# Patient Record
Sex: Male | Born: 1970 | Race: White | Hispanic: No | Marital: Single | Smoking: Never smoker
Health system: Southern US, Community
[De-identification: ages and names within clinical notes are randomized; demographics above are authoritative.]

## PROBLEM LIST (undated history)

## (undated) DIAGNOSIS — R12 Heartburn: Secondary | ICD-10-CM

## (undated) HISTORY — DX: Heartburn: R12

---

## 2000-07-13 ENCOUNTER — Ambulatory Visit (HOSPITAL_COMMUNITY): Admission: RE | Admit: 2000-07-13 | Discharge: 2000-07-13 | Payer: Self-pay

## 2001-02-21 ENCOUNTER — Ambulatory Visit (HOSPITAL_COMMUNITY): Admission: RE | Admit: 2001-02-21 | Discharge: 2001-02-21 | Payer: Self-pay | Admitting: Urology

## 2001-02-21 ENCOUNTER — Encounter: Payer: Self-pay | Admitting: Urology

## 2001-02-23 ENCOUNTER — Emergency Department (HOSPITAL_COMMUNITY): Admission: EM | Admit: 2001-02-23 | Discharge: 2001-02-23 | Payer: Self-pay | Admitting: Emergency Medicine

## 2001-02-23 ENCOUNTER — Encounter: Payer: Self-pay | Admitting: Emergency Medicine

## 2007-01-08 ENCOUNTER — Emergency Department (HOSPITAL_COMMUNITY): Admission: EM | Admit: 2007-01-08 | Discharge: 2007-01-08 | Payer: Self-pay | Admitting: Emergency Medicine

## 2008-01-07 ENCOUNTER — Emergency Department (HOSPITAL_COMMUNITY): Admission: EM | Admit: 2008-01-07 | Discharge: 2008-01-07 | Payer: Self-pay | Admitting: Family Medicine

## 2009-04-07 ENCOUNTER — Ambulatory Visit: Payer: Self-pay | Admitting: Internal Medicine

## 2009-04-07 DIAGNOSIS — Z87442 Personal history of urinary calculi: Secondary | ICD-10-CM

## 2009-04-07 DIAGNOSIS — K219 Gastro-esophageal reflux disease without esophagitis: Secondary | ICD-10-CM

## 2009-04-07 LAB — CONVERTED CEMR LAB
ALT: 12 units/L (ref 0–53)
AST: 17 units/L (ref 0–37)
Albumin: 3.9 g/dL (ref 3.5–5.2)
Alkaline Phosphatase: 69 units/L (ref 39–117)
BUN: 10 mg/dL (ref 6–23)
Basophils Absolute: 0 10*3/uL (ref 0.0–0.1)
Basophils Relative: 0.1 % (ref 0.0–3.0)
Bilirubin Urine: NEGATIVE
Bilirubin, Direct: 0.1 mg/dL (ref 0.0–0.3)
CO2: 33 meq/L — ABNORMAL HIGH (ref 19–32)
Calcium: 9.3 mg/dL (ref 8.4–10.5)
Chloride: 104 meq/L (ref 96–112)
Cholesterol: 191 mg/dL (ref 0–200)
Creatinine, Ser: 1 mg/dL (ref 0.4–1.5)
Eosinophils Absolute: 0.1 10*3/uL (ref 0.0–0.7)
Eosinophils Relative: 1.7 % (ref 0.0–5.0)
GFR calc non Af Amer: 88.83 mL/min (ref >60)
Glucose, Bld: 88 mg/dL (ref 70–99)
HCT: 46.4 % (ref 39.0–52.0)
HDL: 40.3 mg/dL (ref >39.00)
Hemoglobin, Urine: NEGATIVE
Hemoglobin: 16.5 g/dL (ref 13.0–17.0)
Ketones, ur: NEGATIVE mg/dL
LDL Cholesterol: 135 mg/dL — ABNORMAL HIGH (ref 0–99)
Leukocytes, UA: NEGATIVE
Lymphocytes Relative: 30.5 % (ref 12.0–46.0)
Lymphs Abs: 1.9 10*3/uL (ref 0.7–4.0)
MCHC: 35.6 g/dL (ref 30.0–36.0)
MCV: 83.5 fL (ref 78.0–100.0)
Monocytes Absolute: 0.5 10*3/uL (ref 0.1–1.0)
Monocytes Relative: 8.5 % (ref 3.0–12.0)
Neutro Abs: 3.6 10*3/uL (ref 1.4–7.7)
Neutrophils Relative %: 59.2 % (ref 43.0–77.0)
Nitrite: NEGATIVE
Platelets: 200 10*3/uL (ref 150.0–400.0)
Potassium: 4.7 meq/L (ref 3.5–5.1)
RBC: 5.56 M/uL (ref 4.22–5.81)
RDW: 12.8 % (ref 11.5–14.6)
Sodium: 143 meq/L (ref 135–145)
Specific Gravity, Urine: 1.02 (ref 1.000–1.030)
TSH: 1.4 microintl units/mL (ref 0.35–5.50)
Total Bilirubin: 1.2 mg/dL (ref 0.3–1.2)
Total CHOL/HDL Ratio: 5
Total Protein, Urine: NEGATIVE mg/dL
Total Protein: 8 g/dL (ref 6.0–8.3)
Triglycerides: 81 mg/dL (ref 0.0–149.0)
Urine Glucose: NEGATIVE mg/dL
Urobilinogen, UA: 0.2 (ref 0.0–1.0)
VLDL: 16.2 mg/dL (ref 0.0–40.0)
WBC: 6.1 10*3/uL (ref 4.5–10.5)
pH: 6 (ref 5.0–8.0)

## 2009-04-08 LAB — CONVERTED CEMR LAB

## 2010-02-05 ENCOUNTER — Encounter: Admission: RE | Admit: 2010-02-05 | Discharge: 2010-02-05 | Payer: Self-pay | Admitting: Geriatric Medicine

## 2010-03-05 ENCOUNTER — Ambulatory Visit (HOSPITAL_COMMUNITY): Admission: RE | Admit: 2010-03-05 | Discharge: 2010-03-05 | Payer: Self-pay | Admitting: Gastroenterology

## 2010-12-30 ENCOUNTER — Emergency Department: Payer: Self-pay | Admitting: Emergency Medicine

## 2011-05-14 NOTE — Op Note (Signed)
[facility]  Patient:    Michael Meyers, Michael Meyers                   MRN: [id number] Proc. Date: 02/21/01 Adm. Date:  [id number] Attending:  Evlyn Clines CC:         [facility]   Operative Report  PROCEDURE:  Left ureteroscopic stone extraction with insertion of left double-J stent.  PREOPERATIVE DIAGNOSIS:  Left ureterovesical junction stone.  POSTOPERATIVE DIAGNOSIS:  Left ureterovesical junction stone.  SURGEON:  Excell Seltzer. Annabell Howells, M.D.  ANESTHESIA:  General.  SPECIMEN:  Stone.  DRAINS:  6 [place] with 26 cm double-J stent.  COMPLICATIONS:  None.  INDICATIONS:  [name] is a 40 year old white male with a 3-5 mm left UVJ stone, who has failed to pass it despite trial of conservative therapy.  He is to undergo ureteroscopy.  FINDINGS AT PROCEDURE:  The patient was given p.o. Tequin.  He was taken to the operating room where a general anesthetic was induced.  He was placed in lithotomy position.  His perineum and genitalia were prepped with Betadine solution.  He was draped in the usual sterile fashion.  A 6 French short ureteroscope was passed per urethra.  The right ureteral orifice was identified, and a guidewire was passed through the ureteroscope into the distal ureter.  I was unable to pass the ureteroscope because of some stenosis in the distal ureter.  The ureteroscope was then removed.  The wire was advanced to the kidney.  A 22 French cystoscope with a 12 degree lens was inserted over the wire, and a balloon dilation catheter with a 4 cm x 15 French balloon was passed over the wire across the ureteral meatus.  The balloon was dilated to 12 atmospheres until the lace disappeared.  The balloon was deflated and removed.  The cystoscope was removed, leaving the wire up to the kidney and the sheath in place.  The 6 French ureteroscope was then passed alongside the wire up the ureter.  The stone was visualized.  It was grasped with a 3  [place] flatwire basket and removed without difficulty.  The cystoscope was then replaced over the wire, and a 6 French 26 cm double-J stent with string was placed without difficulty to the kidney.  The wire was removed leaving good coil in the kidney and good coil in the bladder. Inspection of the bladder was performed prior to removal of the scope.  No abnormalities were noted.  The bladder was then drained.  The cystoscope was removed leaving the stent string exiting the urethra.  The string was secured to the patients penis.  He was taken down from the lithotomy position.  His anesthetic was reversed.  He was moved to the recovery room in stable condition, and there were no complications. DD:  02/21/01 TD:  02/22/01 Job: [id number]

## 2011-05-14 NOTE — Op Note (Signed)
[facility]  Patient:    Michael Meyers, Michael Meyers                          MRN: [id number] Proc. Date: 07/13/00 Attending:  Zigmund Daniel, M.D.                           Operative Report  PREOPERATIVE DIAGNOSIS:  Symptomatic varicose veins of the right leg.  POSTOPERATIVE DIAGNOSIS:  Symptomatic varicose veins of the right leg.  OPERATION PERFORMED:  Ligation of the long and short saphenous veins in the leg with ligation and excision of perforators and varicosities.  SURGEON:  Zigmund Daniel, M.D.  ANESTHESIA:  General.  DESCRIPTION OF PROCEDURE:  Prior to going to the operating room, I had the patient to stand and I marked all the visible varicosities in the right leg. There were none in the thigh. The long saphenous vein itself did not appear to be varicose although the short saphenous did. After routine preparation and draping of the leg following induction of general anesthesia, I made multiple incisions where the varicosities had been marked. In the upper medial leg, I cut down on the long saphenous vein at the point where it fed off into a branch varicosity which was quite large. I ligated the long saphenous proximally and distally there and then avulsed the varicosity along with many other varicosities. Wherever I discovered perforating veins or small contributing veins, I tied them off with 3-0 Vicryl. I also dissected out the short saphenous vein as it went deep in the proximal posterior leg and ligated it near its entry into the muscle fascia going toward the popliteal vein. Distally I avulsed the short saphenous along with multiple segments of large varicosities particularly in the posterior leg. After removing all the marked varicosities and getting hemostasis with pressure, I closed each skin incision with a staple or two and applied a bulky compressive bandage.  Blood loss was not excessive. The patient tolerated the procedure well. DD:   07/13/00 TD:  07/15/00 Job: [id number]

## 2011-09-16 LAB — POCT RAPID STREP A: Streptococcus, Group A Screen (Direct): POSITIVE — AB

## 2013-11-01 ENCOUNTER — Other Ambulatory Visit: Payer: Self-pay

## 2014-09-17 ENCOUNTER — Ambulatory Visit (INDEPENDENT_AMBULATORY_CARE_PROVIDER_SITE_OTHER): Payer: BC Managed Care – PPO

## 2014-09-17 VITALS — BP 148/96 | HR 64 | Resp 12

## 2014-09-17 DIAGNOSIS — R52 Pain, unspecified: Secondary | ICD-10-CM

## 2014-09-17 DIAGNOSIS — B351 Tinea unguium: Secondary | ICD-10-CM

## 2014-09-17 DIAGNOSIS — M722 Plantar fascial fibromatosis: Secondary | ICD-10-CM

## 2014-09-17 DIAGNOSIS — M7730 Calcaneal spur, unspecified foot: Secondary | ICD-10-CM

## 2014-09-17 MED ORDER — EFINACONAZOLE 10 % EX SOLN: CUTANEOUS | Status: AC

## 2014-09-17 MED ORDER — MELOXICAM 15 MG PO TABS: 15.0000 mg | ORAL_TABLET | Freq: Every day | ORAL | Status: DC

## 2014-09-17 NOTE — Patient Instructions (Signed)
ICE INSTRUCTIONS  Apply ice or cold pack to the affected area at least 3 times a day for 10-15 minutes each time.  You should also use ice after prolonged activity or vigorous exercise.  Do not apply ice longer than 20 minutes at one time.  Always keep a cloth between your skin and the ice pack to prevent burns.  Being consistent and following these instructions will help control your symptoms.  We suggest you purchase a gel ice pack because they are reusable and do bit leak.  Some of them are designed to wrap around the area.  Use the method that works best for you.  Here are some other suggestions for icing.   Use a frozen bag of peas or corn-inexpensive and molds well to your body, usually stays frozen for 10 to 20 minutes.  Wet a towel with cold water and squeeze out the excess until it's damp.  Place in a bag in the freezer for 20 minutes. Then remove and use.   For bathing while tape was in place. Wrap the tape that foot in saran wrap, apply a ankle sock and follow with freezer bag secured with rubber band and duct tape

## 2014-09-17 NOTE — Progress Notes (Signed)
   Subjective:    Patient ID: Michael Meyers, male    DOB: [birth year], 43 y.o.   MRN: [id number]  HPI PT STATED B/L BOTTOM OF THE HEELS BEEN HURTING FOR 9 MONTHS. THE HEELS ARE GETTING WORSE BUT SOMETIMES IS BETTER. THE HEEL GET AGGRAVATED SITTING/WALKING. TRIED ICE AND STRETCHING BUT NO HELP.   ALSO, CHECK LT FOOT GREAT TOENAIL HAVE DISCOLORATION.   Review of Systems  All other systems reviewed and are negative.      Objective:   Physical Exam 43 year old white male well-developed well-nourished oriented x3 presents this time with pain in both heels right more severe than left right is been going on for several months now left is more recent pain on first up in the morning or getting up for a period of rest. Also left hallux nails show some yellowing and thickening discoloration and brittleness consistent with onychomycosis.  Lower extremity objective findings revealed masker status intact pedal pulses palpable DP and PT +2/4 bilateral capillary refill time 3 seconds. Epicritic and proprioceptive sensations intact and symmetric bilateral there is normal plantar response DTRs not elicited dermatologically skin color pigment normal hair growth present nails unremarkable. Patient does occasionally take aspirin, however accidentally marked it as an allergy, this was corrected. Neurologically epicritic and proprioceptive sensations intact normal plantar response and DTRs neurologically skin color pigment normal hair growth absent nails unremarkable except for the left great toenail showing yellowing discoloration and brittleness and friability consistent with onychomycosis both heels painful on palpation medial calcaneal tubercle medial arch bilateral well-developed inferior calcaneus spurs noted on x-rays there is some fascial thickening noted no open wounds no ulcers no secondary infection is noted       Assessment & Plan:  Assessment #1 onychomycosis left hallux nail we'll initiate treatment  with Jubilee a topical antifungal which is called in from underlying pharmacy to patient will apply once daily as instructed for 12 months duration.  Assessment #2 plantar fasciitis bilateral. Fascial strapping applied to both feet recommended ice prescription for [facility] is given also recommend crocs for around the house no barefoot or flimsy shoes recheck in 2 weeks for followup fascial strapping is to be maintained for 5 days as instructed may be candidate for orthoses based on progress  Alvan Dame DPM

## 2014-10-01 ENCOUNTER — Ambulatory Visit: Payer: BC Managed Care – PPO

## 2014-10-01 VITALS — BP 134/83 | HR 63 | Resp 12

## 2014-10-01 DIAGNOSIS — M722 Plantar fascial fibromatosis: Secondary | ICD-10-CM

## 2014-10-01 DIAGNOSIS — M7730 Calcaneal spur, unspecified foot: Secondary | ICD-10-CM

## 2014-10-01 DIAGNOSIS — R52 Pain, unspecified: Secondary | ICD-10-CM

## 2014-10-01 NOTE — Patient Instructions (Signed)

## 2014-10-01 NOTE — Progress Notes (Signed)
   Subjective:    Patient ID: Michael Meyers, male    DOB: 10/04/1971, 43 y.o.   MRN: [id number]  HPI Comments: Pt states he has continued to take the Mobic and the strappings helped some, but the feet are still tender.     Review of Systems no new findings or systemic changes noted    Objective:   Physical Exam Neurovascular status intact pedal pulses are palpable patient had improvement with a fascial strapping based on the fact would benefit from orthoses has a very high deductible otherwise does have coverage may be K. for prescription arthrosis in the future however this time we'll try OTC type power step orthotics. Patient given written instructions for use orthotics in break and we'll reassess in 1-2 months for adjustments if needed       Assessment & Plan:  Assessment plantar fasciitis/heel spur syndrome has been using the topical antifungal as instructed and prescribed to get the medication at this time is using the NSAID orally and the taping health so therefore functional orthoses with beneficial power step orthotics dispensed at this time recheck in one to 2 months for adjustments if needed  Alvan Dameichard Chayce Robbins DPM

## 2014-10-11 ENCOUNTER — Other Ambulatory Visit: Payer: Self-pay

## 2014-11-19 ENCOUNTER — Other Ambulatory Visit: Payer: Self-pay

## 2015-02-21 ENCOUNTER — Other Ambulatory Visit: Payer: Self-pay

## 2015-07-05 ENCOUNTER — Other Ambulatory Visit: Payer: Self-pay

## 2015-07-07 NOTE — Telephone Encounter (Signed)
Pt needs an appt prior to future refills. 

## 2016-02-18 ENCOUNTER — Other Ambulatory Visit (HOSPITAL_COMMUNITY)
Admission: RE | Admit: 2016-02-18 | Discharge: 2016-02-18 | Disposition: A | Discharge status: Home / Self Care | Payer: BLUE CROSS/BLUE SHIELD | Source: Ambulatory Visit | Attending: Family Medicine | Admitting: Family Medicine

## 2016-02-18 ENCOUNTER — Emergency Department (INDEPENDENT_AMBULATORY_CARE_PROVIDER_SITE_OTHER)
Admission: EM | Admit: 2016-02-18 | Discharge: 2016-02-18 | Disposition: A | Discharge status: Home / Self Care | Payer: BLUE CROSS/BLUE SHIELD | Source: Home / Self Care | Attending: Family Medicine | Admitting: Family Medicine

## 2016-02-18 ENCOUNTER — Encounter (HOSPITAL_COMMUNITY): Payer: Self-pay | Admitting: *Deleted

## 2016-02-18 DIAGNOSIS — J111 Influenza due to unidentified influenza virus with other respiratory manifestations: Secondary | ICD-10-CM | POA: Insufficient documentation

## 2016-02-18 DIAGNOSIS — R69 Illness, unspecified: Principal | ICD-10-CM

## 2016-02-18 LAB — POCT RAPID STREP A: Streptococcus, Group A Screen (Direct): NEGATIVE

## 2016-02-18 MED ORDER — IBUPROFEN 800 MG PO TABS: 800.0000 mg | ORAL_TABLET | Freq: Three times a day (TID) | ORAL | Status: AC

## 2016-02-18 MED ORDER — IPRATROPIUM BROMIDE 0.06 % NA SOLN: 2.0000 | Freq: Four times a day (QID) | NASAL | Status: AC

## 2016-02-18 MED ORDER — ONDANSETRON HCL 4 MG PO TABS
4.0000 mg | ORAL_TABLET | Freq: Three times a day (TID) | ORAL | Status: AC | PRN
Start: 2016-02-18 — End: ?

## 2016-02-18 MED ORDER — ONDANSETRON HCL 4 MG/2ML IJ SOLN
4.0000 mg | Freq: Once | INTRAMUSCULAR | Status: AC
  Administered 2016-02-18: 4 mg via INTRAMUSCULAR

## 2016-02-18 MED ORDER — ONDANSETRON HCL 4 MG/2ML IJ SOLN
INTRAMUSCULAR | Status: AC
  Filled 2016-02-18: qty 2
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
## 2016-02-18 NOTE — ED Notes (Signed)
Pt  Reports  Symptoms  Of       Headache      Body   Aches           sorethroat       As  Well          With  Symptoms  X  4  Days              Pt  Reports  Frequent  Urination  As   Well
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
## 2016-02-21 LAB — CULTURE, GROUP A STREP (THRC)

## 2016-09-10 ENCOUNTER — Other Ambulatory Visit: Payer: Self-pay | Admitting: Podiatry

## 2016-09-24 ENCOUNTER — Telehealth: Payer: Self-pay | Admitting: *Deleted

## 2016-09-24 NOTE — Telephone Encounter (Signed)
Received fax request for refill Meloxicam. Denied pt has not been seen in office since 2015. Return faxed.

## 2016-10-11 DIAGNOSIS — S90421A Blister (nonthermal), right great toe, initial encounter: Secondary | ICD-10-CM | POA: Diagnosis not present

## 2016-10-11 DIAGNOSIS — S90821A Blister (nonthermal), right foot, initial encounter: Secondary | ICD-10-CM | POA: Diagnosis not present

## 2016-10-11 DIAGNOSIS — L0889 Other specified local infections of the skin and subcutaneous tissue: Secondary | ICD-10-CM | POA: Diagnosis not present

## 2016-10-11 DIAGNOSIS — L089 Local infection of the skin and subcutaneous tissue, unspecified: Secondary | ICD-10-CM | POA: Diagnosis not present

## 2018-09-12 ENCOUNTER — Ambulatory Visit
Admission: RE | Admit: 2018-09-12 | Discharge: 2018-09-12 | Disposition: A | Discharge status: Home / Self Care | Payer: BLUE CROSS/BLUE SHIELD | Source: Ambulatory Visit | Attending: Geriatric Medicine | Admitting: Geriatric Medicine

## 2018-09-12 ENCOUNTER — Other Ambulatory Visit: Payer: Self-pay | Admitting: Geriatric Medicine

## 2018-09-12 DIAGNOSIS — M542 Cervicalgia: Secondary | ICD-10-CM | POA: Diagnosis not present

## 2018-09-12 DIAGNOSIS — E7800 Pure hypercholesterolemia, unspecified: Secondary | ICD-10-CM | POA: Diagnosis not present

## 2018-09-12 DIAGNOSIS — D649 Anemia, unspecified: Secondary | ICD-10-CM | POA: Diagnosis not present

## 2018-09-12 DIAGNOSIS — Z23 Encounter for immunization: Secondary | ICD-10-CM | POA: Diagnosis not present

## 2018-09-12 DIAGNOSIS — Z79899 Other long term (current) drug therapy: Secondary | ICD-10-CM | POA: Diagnosis not present

## 2018-09-12 DIAGNOSIS — Z0000 Encounter for general adult medical examination without abnormal findings: Secondary | ICD-10-CM | POA: Diagnosis not present

## 2018-09-13 ENCOUNTER — Other Ambulatory Visit: Payer: Self-pay | Admitting: Geriatric Medicine

## 2018-09-13 DIAGNOSIS — R7401 Elevation of levels of liver transaminase levels: Secondary | ICD-10-CM

## 2018-09-13 DIAGNOSIS — R740 Nonspecific elevation of levels of transaminase and lactic acid dehydrogenase [LDH]: Principal | ICD-10-CM

## 2018-09-20 ENCOUNTER — Ambulatory Visit
Admission: RE | Admit: 2018-09-20 | Discharge: 2018-09-20 | Disposition: A | Discharge status: Home / Self Care | Payer: BLUE CROSS/BLUE SHIELD | Source: Ambulatory Visit | Attending: Geriatric Medicine | Admitting: Geriatric Medicine

## 2018-09-20 DIAGNOSIS — R740 Nonspecific elevation of levels of transaminase and lactic acid dehydrogenase [LDH]: Principal | ICD-10-CM

## 2018-09-20 DIAGNOSIS — B179 Acute viral hepatitis, unspecified: Secondary | ICD-10-CM | POA: Diagnosis not present

## 2018-09-20 DIAGNOSIS — R7401 Elevation of levels of liver transaminase levels: Secondary | ICD-10-CM

## 2018-09-21 DIAGNOSIS — R748 Abnormal levels of other serum enzymes: Secondary | ICD-10-CM | POA: Diagnosis not present

## 2018-09-21 DIAGNOSIS — K219 Gastro-esophageal reflux disease without esophagitis: Secondary | ICD-10-CM | POA: Diagnosis not present

## 2018-10-13 DIAGNOSIS — B179 Acute viral hepatitis, unspecified: Secondary | ICD-10-CM | POA: Diagnosis not present

## 2018-11-02 DIAGNOSIS — R740 Nonspecific elevation of levels of transaminase and lactic acid dehydrogenase [LDH]: Secondary | ICD-10-CM | POA: Diagnosis not present

## 2018-11-27 DIAGNOSIS — B169 Acute hepatitis B without delta-agent and without hepatic coma: Secondary | ICD-10-CM | POA: Diagnosis not present

## 2019-02-05 IMAGING — US US ABDOMEN LIMITED
1 series · 14 of 25 positions shown · non-contrast
Comparison: Patient's prior CT of the abdomen is not available on
PACs for comparison.

CLINICAL DATA: Elevated transaminase level.

EXAM:
ULTRASOUND ABDOMEN LIMITED RIGHT UPPER QUADRANT

[Series 1: us abdomen limited · 0.26mm/px · 14 of 41 slices shown]
[im 1/41]
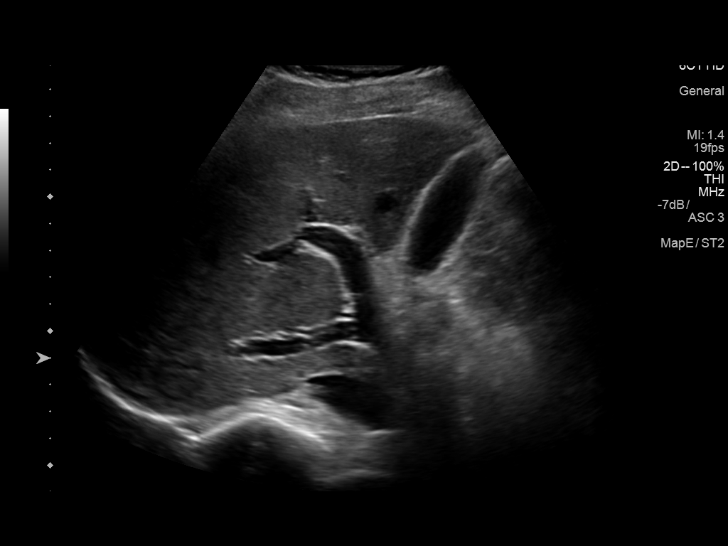
[im 4/41]
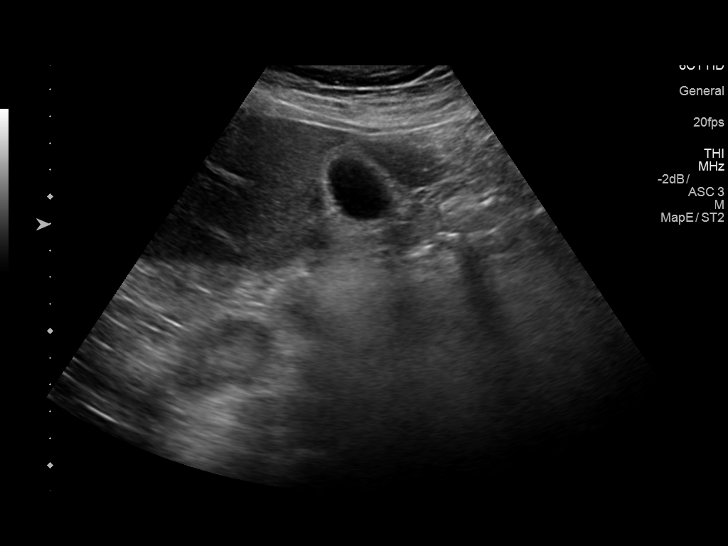
[im 7/41]
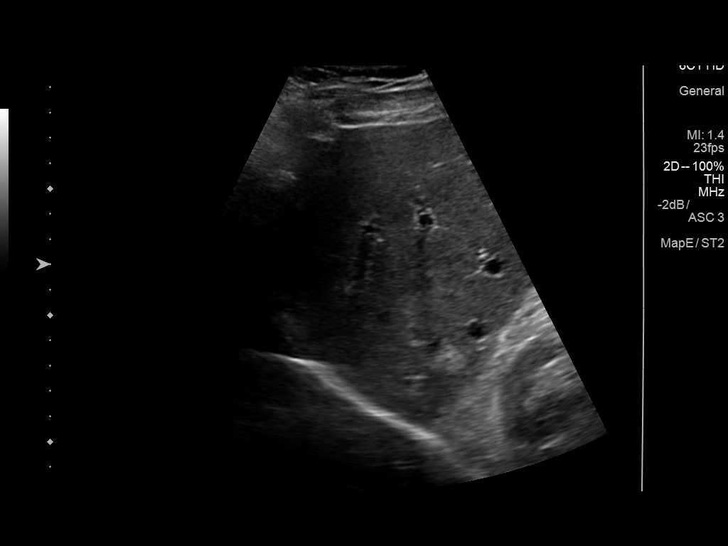
[im 11/41]
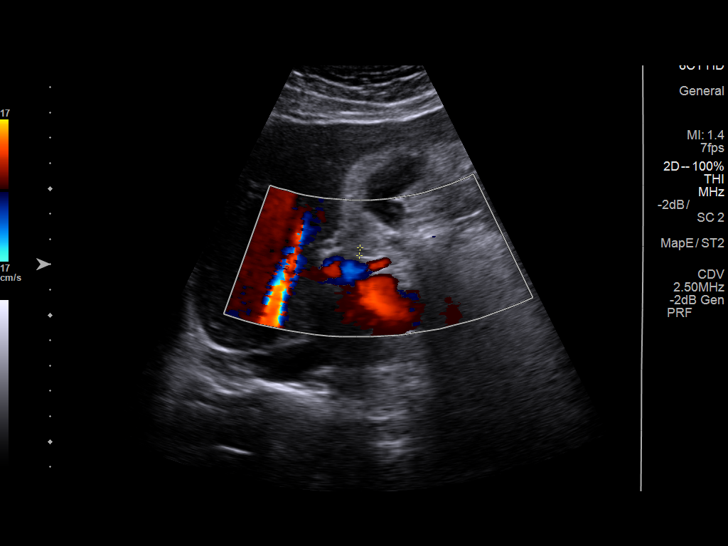
[im 14/41]
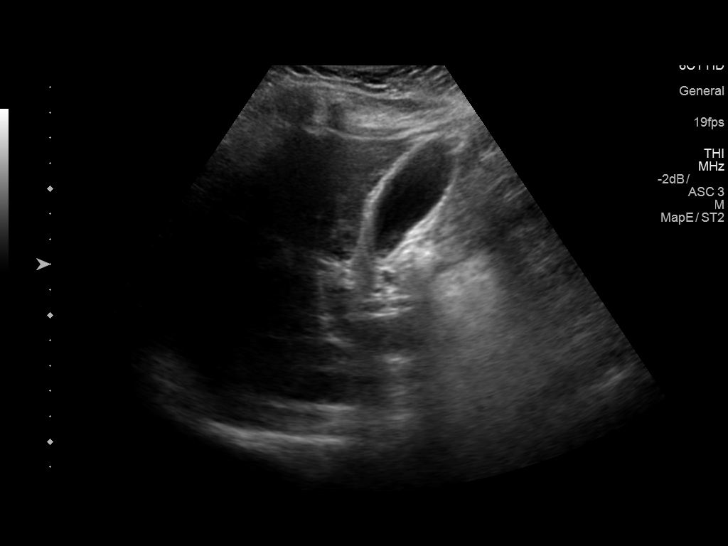
[im 16/41]
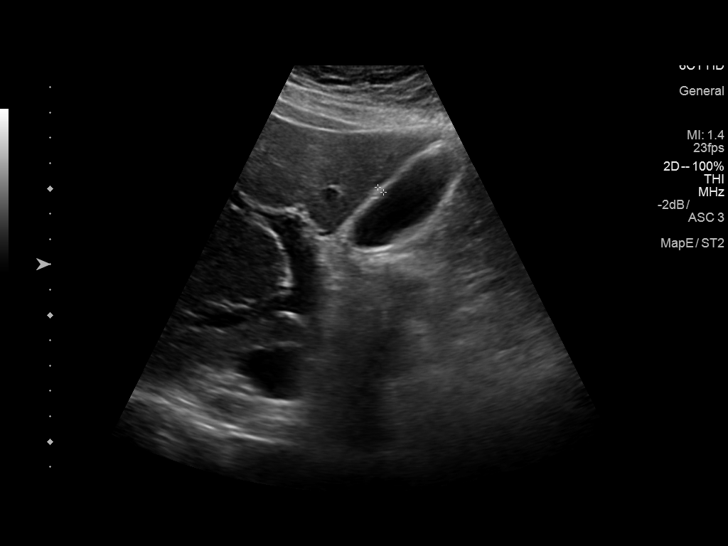
[im 19/41]
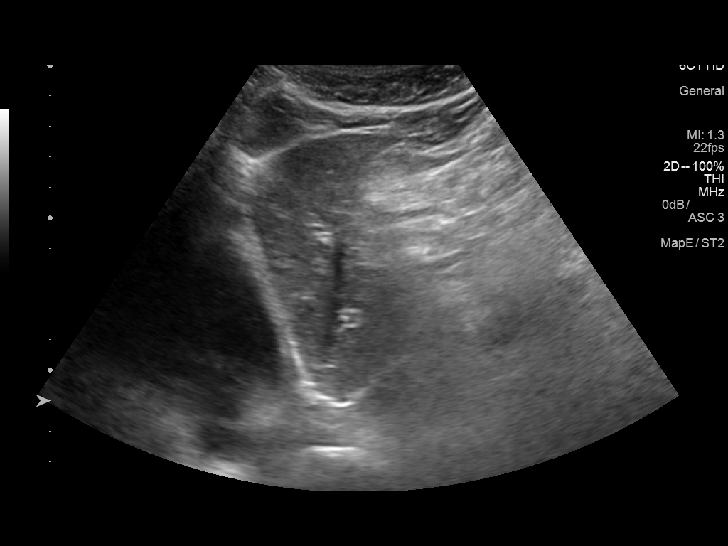
[im 22/41]
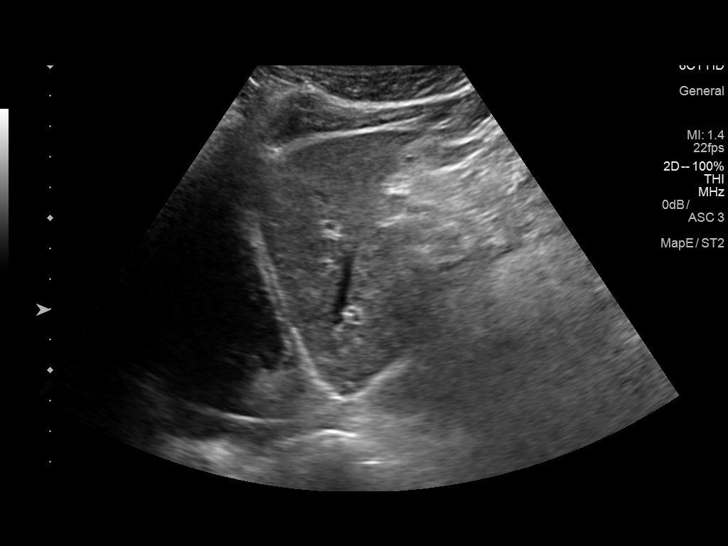
[im 26/41]
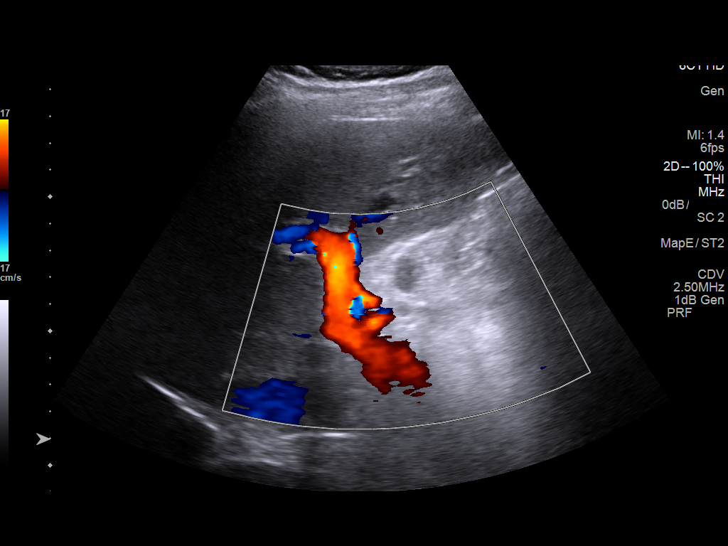
[im 27/41]
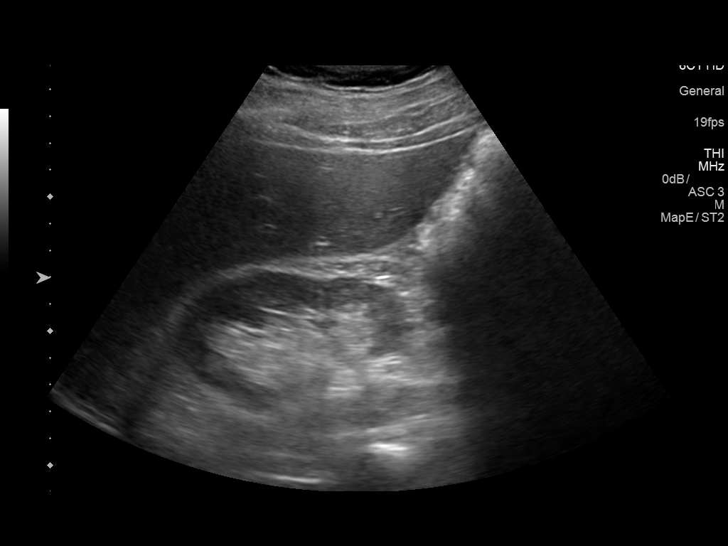
[im 31/41]
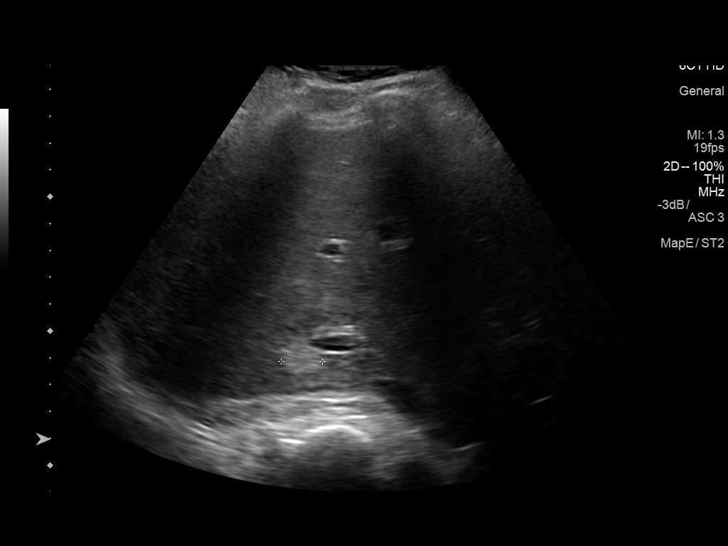
[im 34/41]
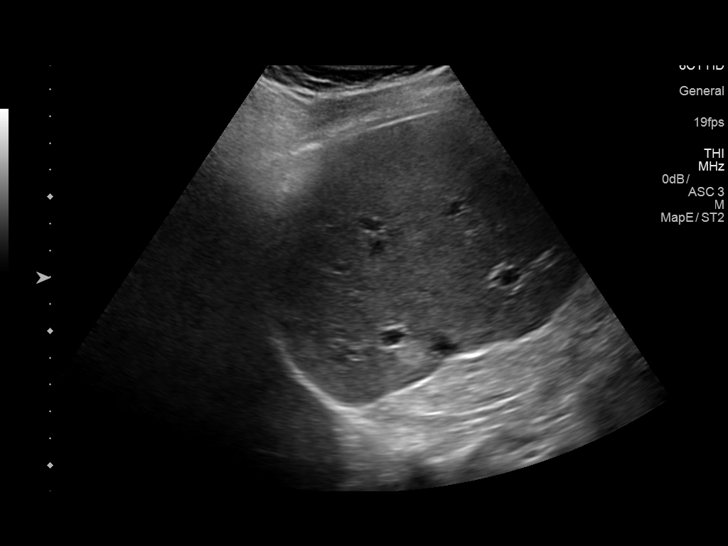
[im 37/41]
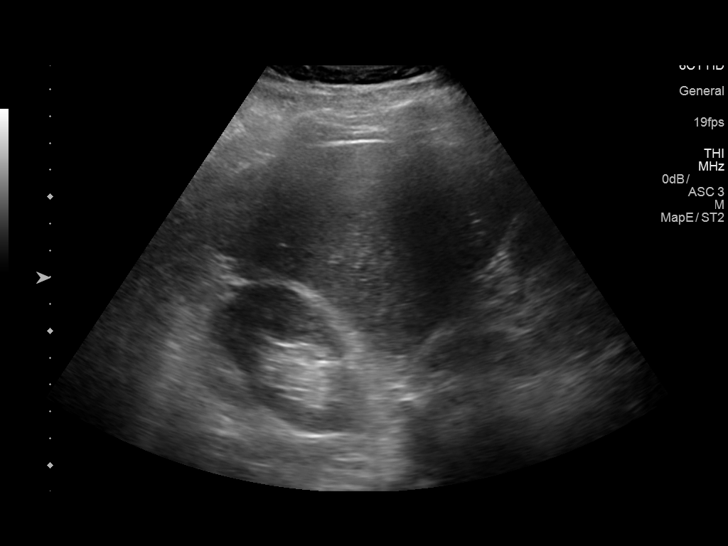
[im 41/41]
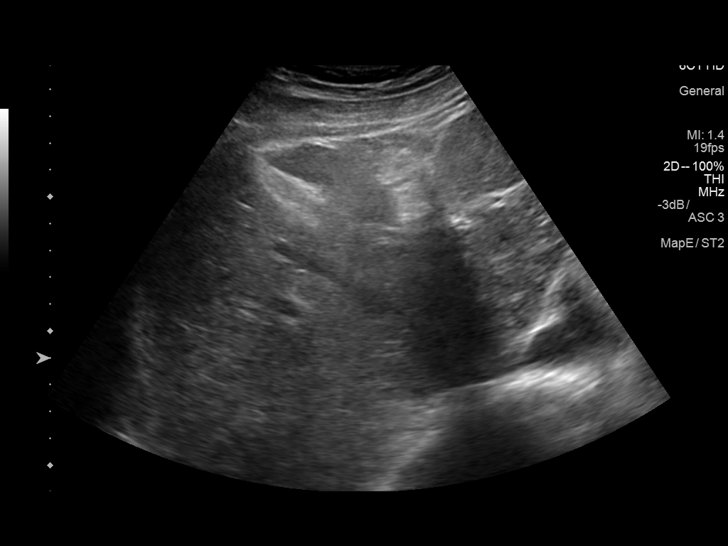

[14 of 25 positions shown; findings below may reference images not displayed]

FINDINGS: Gallbladder:

No gallstones visualized. The gallbladder wall measures 3 mm. No
sonographic Murphy sign noted by sonographer.

Common bile duct:

Diameter: 3.1 mm

Liver:

Focal hyperechogenicity is identified in the right lobe liver
measuring 1.5 x 1.3 x 1.5 cm. Within normal limits in parenchymal
echogenicity. Portal vein is patent on color Doppler imaging with
normal direction of blood flow towards the liver.
IMPRESSION: Focal hyperechogenicity is identified in the right lobe liver
measuring 1.5 x 1.3 x 1.5 cm. Solid lesion is not excluded.

## 2019-02-26 DIAGNOSIS — B169 Acute hepatitis B without delta-agent and without hepatic coma: Secondary | ICD-10-CM | POA: Diagnosis not present

## 2019-09-25 DIAGNOSIS — Z23 Encounter for immunization: Secondary | ICD-10-CM | POA: Diagnosis not present

## 2019-09-25 DIAGNOSIS — Z79899 Other long term (current) drug therapy: Secondary | ICD-10-CM | POA: Diagnosis not present

## 2019-09-25 DIAGNOSIS — Z0000 Encounter for general adult medical examination without abnormal findings: Secondary | ICD-10-CM | POA: Diagnosis not present

## 2019-09-25 DIAGNOSIS — E7800 Pure hypercholesterolemia, unspecified: Secondary | ICD-10-CM | POA: Diagnosis not present

## 2020-04-25 ENCOUNTER — Ambulatory Visit: Payer: BC Managed Care – PPO | Attending: Internal Medicine

## 2020-04-25 DIAGNOSIS — Z23 Encounter for immunization: Secondary | ICD-10-CM

## 2020-04-25 NOTE — Progress Notes (Signed)
   Covid-19 Vaccination Clinic  Name:  Michael Meyers    MRN: [id number] DOB: 11-30-1971  04/25/2020  Mr. Szabo was observed post Covid-19 immunization for 15 minutes without incident. He was provided with Vaccine Information Sheet and instruction to access the V-Safe system.   Mr. Rocks was instructed to call 911 with any severe reactions post vaccine: Marland Kitchen Difficulty breathing  . Swelling of face and throat  . A fast heartbeat  . A bad rash all over body  . Dizziness and weakness   Immunizations Administered    Name Date Dose VIS Date Route   Pfizer COVID-19 Vaccine 04/25/2020  4:51 PM 0.3 mL 02/20/2019 Intramuscular   Manufacturer: [employer], [employer]   Lot: [id number]   NDC: [address]-6

## 2020-05-19 ENCOUNTER — Ambulatory Visit: Payer: BC Managed Care – PPO | Attending: Internal Medicine

## 2020-05-19 DIAGNOSIS — Z23 Encounter for immunization: Secondary | ICD-10-CM

## 2020-05-19 NOTE — Progress Notes (Signed)
   Covid-19 Vaccination Clinic  Name:  Michael Meyers    MRN: [id number] DOB: [birth year]  05/19/2020  Michael Meyers was observed post Covid-19 immunization for 15 minutes without incident. He was provided with Vaccine Information Sheet and instruction to access the V-Safe system.   Michael Meyers was instructed to call 911 with any severe reactions post vaccine: Marland Kitchen Difficulty breathing  . Swelling of face and throat  . A fast heartbeat  . A bad rash all over body  . Dizziness and weakness   Immunizations Administered    Name Date Dose VIS Date Route   Pfizer COVID-19 Vaccine 05/19/2020  3:53 PM 0.3 mL 02/20/2019 Intramuscular   Manufacturer: [employer], [employer]   Lot: [id number]   NDC: [address]-0

## 2020-10-17 DIAGNOSIS — U071 COVID-19: Secondary | ICD-10-CM | POA: Diagnosis not present

## 2021-07-21 DIAGNOSIS — Z125 Encounter for screening for malignant neoplasm of prostate: Secondary | ICD-10-CM | POA: Diagnosis not present

## 2021-07-21 DIAGNOSIS — K9089 Other intestinal malabsorption: Secondary | ICD-10-CM | POA: Diagnosis not present

## 2021-07-21 DIAGNOSIS — E7800 Pure hypercholesterolemia, unspecified: Secondary | ICD-10-CM | POA: Diagnosis not present

## 2021-07-21 DIAGNOSIS — Z0000 Encounter for general adult medical examination without abnormal findings: Secondary | ICD-10-CM | POA: Diagnosis not present

## 2021-07-21 DIAGNOSIS — Z23 Encounter for immunization: Secondary | ICD-10-CM | POA: Diagnosis not present

## 2021-07-21 DIAGNOSIS — Z79899 Other long term (current) drug therapy: Secondary | ICD-10-CM | POA: Diagnosis not present

## 2021-09-29 DIAGNOSIS — H0288A Meibomian gland dysfunction right eye, upper and lower eyelids: Secondary | ICD-10-CM | POA: Diagnosis not present

## 2021-09-29 DIAGNOSIS — H524 Presbyopia: Secondary | ICD-10-CM | POA: Diagnosis not present

## 2021-09-29 DIAGNOSIS — H0102B Squamous blepharitis left eye, upper and lower eyelids: Secondary | ICD-10-CM | POA: Diagnosis not present

## 2021-09-29 DIAGNOSIS — H52221 Regular astigmatism, right eye: Secondary | ICD-10-CM | POA: Diagnosis not present

## 2021-09-29 DIAGNOSIS — H0102A Squamous blepharitis right eye, upper and lower eyelids: Secondary | ICD-10-CM | POA: Diagnosis not present

## 2021-09-29 DIAGNOSIS — H5213 Myopia, bilateral: Secondary | ICD-10-CM | POA: Diagnosis not present

## 2021-09-29 DIAGNOSIS — H0288B Meibomian gland dysfunction left eye, upper and lower eyelids: Secondary | ICD-10-CM | POA: Diagnosis not present

## 2022-04-01 DIAGNOSIS — Z20822 Contact with and (suspected) exposure to covid-19: Secondary | ICD-10-CM | POA: Diagnosis not present

## 2022-08-18 DIAGNOSIS — K9089 Other intestinal malabsorption: Secondary | ICD-10-CM | POA: Diagnosis not present

## 2022-08-18 DIAGNOSIS — Z79899 Other long term (current) drug therapy: Secondary | ICD-10-CM | POA: Diagnosis not present

## 2022-08-18 DIAGNOSIS — D649 Anemia, unspecified: Secondary | ICD-10-CM | POA: Diagnosis not present

## 2022-08-18 DIAGNOSIS — E7800 Pure hypercholesterolemia, unspecified: Secondary | ICD-10-CM | POA: Diagnosis not present

## 2022-08-18 DIAGNOSIS — Z0000 Encounter for general adult medical examination without abnormal findings: Secondary | ICD-10-CM | POA: Diagnosis not present

## 2022-08-18 DIAGNOSIS — Z23 Encounter for immunization: Secondary | ICD-10-CM | POA: Diagnosis not present

## 2022-08-18 DIAGNOSIS — Z125 Encounter for screening for malignant neoplasm of prostate: Secondary | ICD-10-CM | POA: Diagnosis not present

## 2022-10-08 DIAGNOSIS — D509 Iron deficiency anemia, unspecified: Secondary | ICD-10-CM | POA: Diagnosis not present

## 2022-10-08 DIAGNOSIS — K219 Gastro-esophageal reflux disease without esophagitis: Secondary | ICD-10-CM | POA: Diagnosis not present

## 2023-06-08 ENCOUNTER — Other Ambulatory Visit: Payer: Self-pay

## 2023-06-08 ENCOUNTER — Encounter (HOSPITAL_BASED_OUTPATIENT_CLINIC_OR_DEPARTMENT_OTHER): Payer: Self-pay

## 2023-06-08 ENCOUNTER — Emergency Department (HOSPITAL_BASED_OUTPATIENT_CLINIC_OR_DEPARTMENT_OTHER)
Admission: EM | Admit: 2023-06-08 | Discharge: 2023-06-08 | Disposition: A | Discharge status: Home / Self Care | Payer: BC Managed Care – PPO | Attending: Emergency Medicine | Admitting: Emergency Medicine

## 2023-06-08 DIAGNOSIS — K0889 Other specified disorders of teeth and supporting structures: Secondary | ICD-10-CM | POA: Diagnosis not present

## 2023-06-08 MED ORDER — OXYCODONE-ACETAMINOPHEN 5-325 MG PO TABS: 1.0000 | ORAL_TABLET | Freq: Four times a day (QID) | ORAL | 0 refills | Status: AC | PRN

## 2023-06-08 MED ORDER — OXYCODONE-ACETAMINOPHEN 5-325 MG PO TABS: 1.0000 | ORAL_TABLET | Freq: Four times a day (QID) | ORAL | 0 refills | Status: DC | PRN

## 2023-06-08 NOTE — ED Triage Notes (Signed)
Pt complaining of pain in the rt lower jaw from an abscessed tooth. Went to his dentist this afternoon and they gave him amoxicillin. But the pain is not going away.

## 2023-06-08 NOTE — ED Notes (Signed)
Reviewed AVS with patient, patient expressed understanding of directions, denies further questions at this time. 

## 2023-06-08 NOTE — Discharge Instructions (Addendum)
Take ibuprofen 600 mg every 6 hours as needed for pain.  Begin taking Percocet as prescribed as needed for pain not relieved with ibuprofen.  Follow-up with your dentist in the next few days.

## 2023-06-08 NOTE — ED Provider Notes (Signed)
  [place] EMERGENCY DEPARTMENT AT [facility] Provider Note   CSN: [id number] Arrival date & time: 06/08/23  0414     History  Chief Complaint  Patient presents with   Dental Pain    Michael Meyers is a 52 y.o. male.  Patient is a 52 year old male presenting with complaints of dental pain.  He began with swelling to his right lower jaw yesterday.  He saw his doctor there was prescribed amoxicillin.  He has taken 2 doses of this, but is having pain that is not allowing him to sleep.  He denies any fevers or chills.  He denies difficulty breathing or swallowing.  The history is provided by the patient.       Home Medications Prior to Admission medications   Medication Sig Start Date End Date Taking? Authorizing Provider  Efinaconazole (JUBLIA) 10 % SOLN Apply one or 2 drop to each affected nail once daily for 12 month 09/17/14   Alvan Dame, DPM  ibuprofen (ADVIL,MOTRIN) 800 MG tablet Take 1 tablet (800 mg total) by mouth 3 (three) times daily. 02/18/16   Linna Hoff, MD  ipratropium (ATROVENT) 0.06 % nasal spray Place 2 sprays into both nostrils 4 (four) times daily. 02/18/16   Linna Hoff, MD  meloxicam (MOBIC) 15 MG tablet TAKE 1 TABLET (15 MG TOTAL) BY MOUTH DAILY. 07/07/15   Lenn Sink, DPM  omeprazole (PRILOSEC) 40 MG capsule Take 40 mg by mouth daily.    [provider]  ondansetron (ZOFRAN) 4 MG tablet Take 1 tablet (4 mg total) by mouth every 8 (eight) hours as needed for nausea or vomiting. Prn n/v 02/18/16   Linna Hoff, MD      Allergies    Patient has no active allergies.    Review of Systems   Review of Systems  All other systems reviewed and are negative.   Physical Exam Updated Vital Signs BP (!) 163/95 (BP Location: Right Arm)   Pulse (!) 48   Temp 98.2 F (36.8 C) (Oral)   Resp 18   Ht 6' (1.829 m)   Wt 83 kg   SpO2 99%   BMI 24.82 kg/m  Physical Exam Vitals and nursing note reviewed.  Constitutional:       Appearance: Normal appearance.  HENT:     Mouth/Throat:     Comments: There is swelling noted to the right lower mandible.  The bottom right first molar has a crown and surrounding gingival inflammation, but no definite intraoral abscess Pulmonary:     Effort: Pulmonary effort is normal.  Skin:    General: Skin is warm and dry.  Neurological:     Mental Status: He is alert.     ED Results / Procedures / Treatments   Labs (all labs ordered are listed, but only abnormal results are displayed) Labs Reviewed - No data to display  EKG None  Radiology No results found.  Procedures Procedures    Medications Ordered in ED Medications - No data to display  ED Course/ Medical Decision Making/ A&P  Patient already taking amoxicillin for dental pain.  I will prescribe pain medication and have him follow-up with his dentist.  Final Clinical Impression(s) / ED Diagnoses Final diagnoses:  None    Rx / DC Orders ED Discharge Orders     None         Geoffery Lyons, MD 06/08/23 [phone]

## 2023-07-04 ENCOUNTER — Other Ambulatory Visit: Payer: Self-pay

## 2023-09-20 DIAGNOSIS — M79662 Pain in left lower leg: Secondary | ICD-10-CM | POA: Diagnosis not present

## 2023-09-20 DIAGNOSIS — I83893 Varicose veins of bilateral lower extremities with other complications: Secondary | ICD-10-CM | POA: Diagnosis not present

## 2023-09-20 DIAGNOSIS — I87393 Chronic venous hypertension (idiopathic) with other complications of bilateral lower extremity: Secondary | ICD-10-CM | POA: Diagnosis not present

## 2023-09-20 DIAGNOSIS — M79604 Pain in right leg: Secondary | ICD-10-CM | POA: Diagnosis not present

## 2023-09-20 DIAGNOSIS — M79661 Pain in right lower leg: Secondary | ICD-10-CM | POA: Diagnosis not present

## 2023-10-06 DIAGNOSIS — J029 Acute pharyngitis, unspecified: Secondary | ICD-10-CM | POA: Diagnosis not present

## 2023-10-06 DIAGNOSIS — Z03818 Encounter for observation for suspected exposure to other biological agents ruled out: Secondary | ICD-10-CM | POA: Diagnosis not present

## 2023-10-19 DIAGNOSIS — K219 Gastro-esophageal reflux disease without esophagitis: Secondary | ICD-10-CM | POA: Diagnosis not present

## 2023-10-19 DIAGNOSIS — Z125 Encounter for screening for malignant neoplasm of prostate: Secondary | ICD-10-CM | POA: Diagnosis not present

## 2023-10-19 DIAGNOSIS — K9089 Other intestinal malabsorption: Secondary | ICD-10-CM | POA: Diagnosis not present

## 2023-10-19 DIAGNOSIS — Z79899 Other long term (current) drug therapy: Secondary | ICD-10-CM | POA: Diagnosis not present

## 2023-10-19 DIAGNOSIS — Z23 Encounter for immunization: Secondary | ICD-10-CM | POA: Diagnosis not present

## 2023-10-19 DIAGNOSIS — D509 Iron deficiency anemia, unspecified: Secondary | ICD-10-CM | POA: Diagnosis not present

## 2023-10-19 DIAGNOSIS — E7800 Pure hypercholesterolemia, unspecified: Secondary | ICD-10-CM | POA: Diagnosis not present

## 2023-10-19 DIAGNOSIS — Z0000 Encounter for general adult medical examination without abnormal findings: Secondary | ICD-10-CM | POA: Diagnosis not present

## 2023-10-19 DIAGNOSIS — I83892 Varicose veins of left lower extremities with other complications: Secondary | ICD-10-CM | POA: Diagnosis not present

## 2023-11-02 DIAGNOSIS — I83891 Varicose veins of right lower extremities with other complications: Secondary | ICD-10-CM | POA: Diagnosis not present

## 2023-11-02 DIAGNOSIS — I83892 Varicose veins of left lower extremities with other complications: Secondary | ICD-10-CM | POA: Diagnosis not present

## 2023-11-02 DIAGNOSIS — Z09 Encounter for follow-up examination after completed treatment for conditions other than malignant neoplasm: Secondary | ICD-10-CM | POA: Diagnosis not present

## 2023-11-14 DIAGNOSIS — I83892 Varicose veins of left lower extremities with other complications: Secondary | ICD-10-CM | POA: Diagnosis not present

## 2023-11-14 DIAGNOSIS — Z01818 Encounter for other preprocedural examination: Secondary | ICD-10-CM | POA: Diagnosis not present

## 2023-11-17 DIAGNOSIS — I83891 Varicose veins of right lower extremities with other complications: Secondary | ICD-10-CM | POA: Diagnosis not present

## 2023-12-01 DIAGNOSIS — I83892 Varicose veins of left lower extremities with other complications: Secondary | ICD-10-CM | POA: Diagnosis not present

## 2023-12-15 DIAGNOSIS — I83891 Varicose veins of right lower extremities with other complications: Secondary | ICD-10-CM | POA: Diagnosis not present

## 2024-01-18 DIAGNOSIS — I83892 Varicose veins of left lower extremities with other complications: Secondary | ICD-10-CM | POA: Diagnosis not present

## 2024-02-14 DIAGNOSIS — I83892 Varicose veins of left lower extremities with other complications: Secondary | ICD-10-CM | POA: Diagnosis not present
